# Patient Record
Sex: Female | Born: 1955 | Race: White | Hispanic: No | Marital: Married
Health system: Southern US, Community
[De-identification: ages and names within clinical notes are randomized; demographics above are authoritative.]

## PROBLEM LIST (undated history)

## (undated) DIAGNOSIS — C801 Malignant (primary) neoplasm, unspecified: Secondary | ICD-10-CM

## (undated) DIAGNOSIS — H409 Unspecified glaucoma: Secondary | ICD-10-CM

## (undated) HISTORY — DX: Unspecified glaucoma: H40.9

---

## 2007-05-12 ENCOUNTER — Ambulatory Visit: Payer: Self-pay | Admitting: Otolaryngology

## 2008-11-15 ENCOUNTER — Ambulatory Visit: Payer: Self-pay | Admitting: Otolaryngology

## 2008-12-16 ENCOUNTER — Ambulatory Visit: Payer: Self-pay | Admitting: Otolaryngology

## 2008-12-18 ENCOUNTER — Ambulatory Visit: Payer: Self-pay | Admitting: Otolaryngology

## 2010-08-10 ENCOUNTER — Ambulatory Visit: Payer: Self-pay

## 2011-02-23 HISTORY — PX: BREAST BIOPSY: SHX20

## 2011-06-24 ENCOUNTER — Ambulatory Visit: Payer: Self-pay | Admitting: Internal Medicine

## 2011-07-05 ENCOUNTER — Ambulatory Visit: Payer: Self-pay | Admitting: Internal Medicine

## 2011-08-02 ENCOUNTER — Ambulatory Visit: Payer: Self-pay | Admitting: Surgery

## 2011-08-03 LAB — PATHOLOGY REPORT

## 2012-01-06 ENCOUNTER — Ambulatory Visit: Payer: Self-pay | Admitting: Surgery

## 2012-06-26 ENCOUNTER — Ambulatory Visit: Payer: Self-pay | Admitting: Surgery

## 2013-12-31 ENCOUNTER — Ambulatory Visit: Payer: Self-pay | Admitting: Internal Medicine

## 2014-02-03 IMAGING — US ULTRASOUND LEFT BREAST
1 series · 16 of 25 positions shown · non-contrast
Comparison: none

REASON FOR EXAM: AV LT ASYMMETRY
COMMENTS:

PROCEDURE:     US  - US BREAST LEFT  - July 05, 2011  [DATE]
RESULT:
COMPARISON STUDY:   06/24/2011, [DATE], 05/29/07 and 05/27/2006.

[Series 1: ultrasound left breast · 42 acquisitions, 16 frames shown]
[im 1/42]
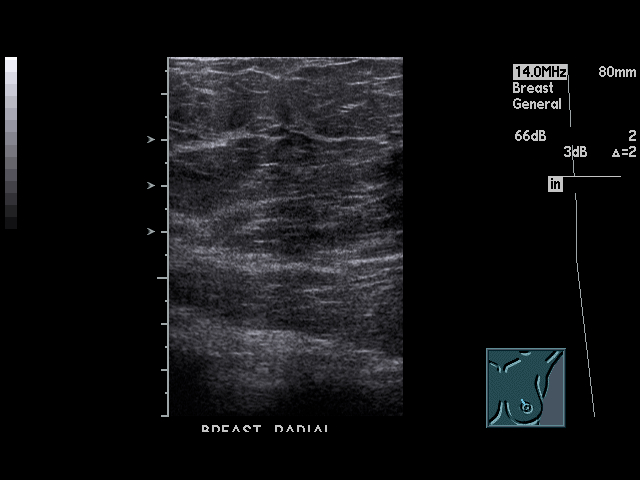
[im 4/42]
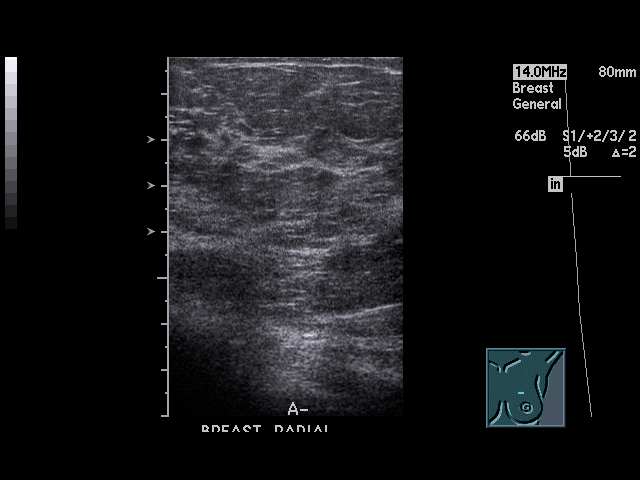
[im 6/42]
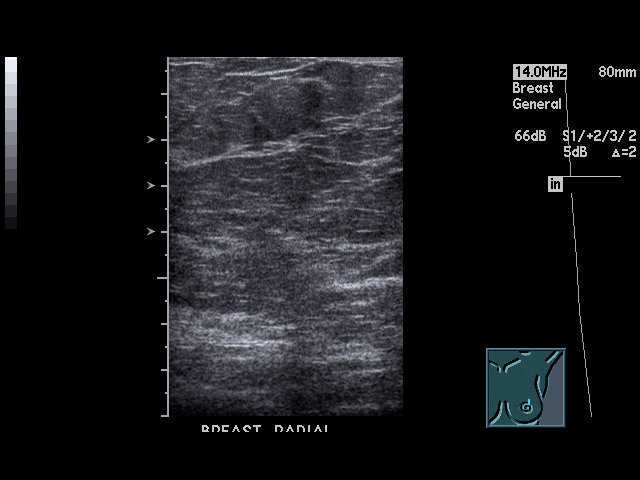
[im 9/42]
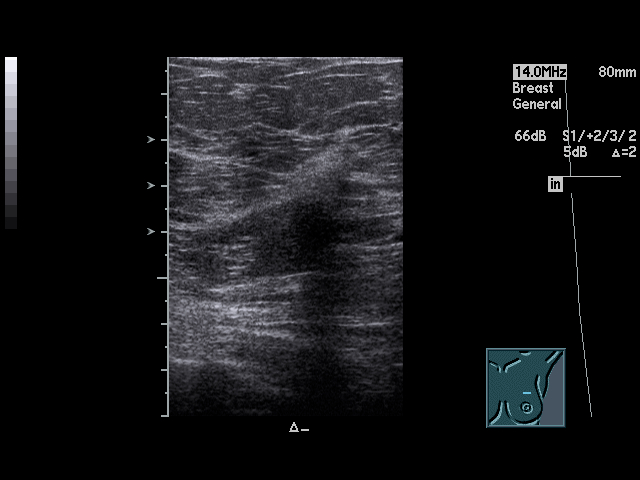
[im 12/42]
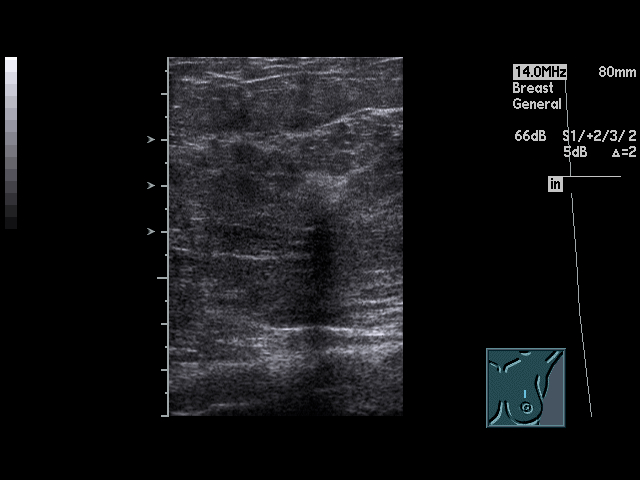
[im 14/42]
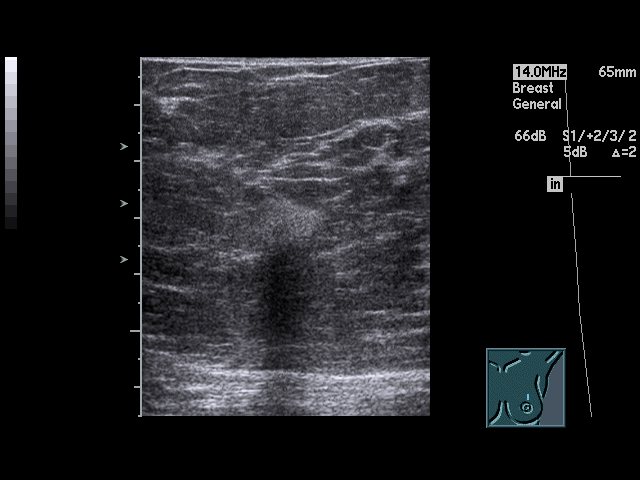
[im 18/42]
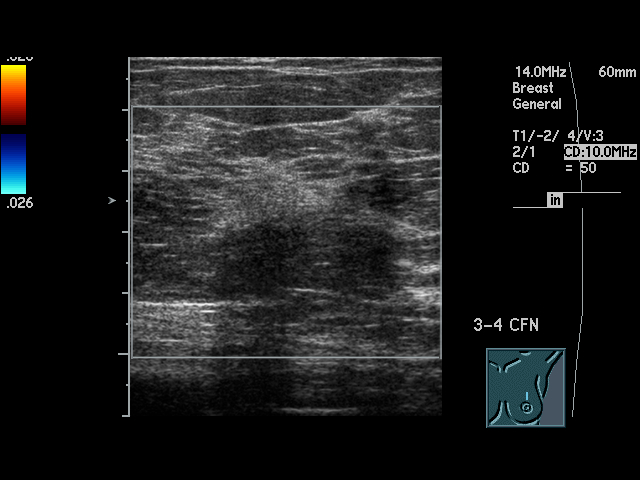
[im 19/42]
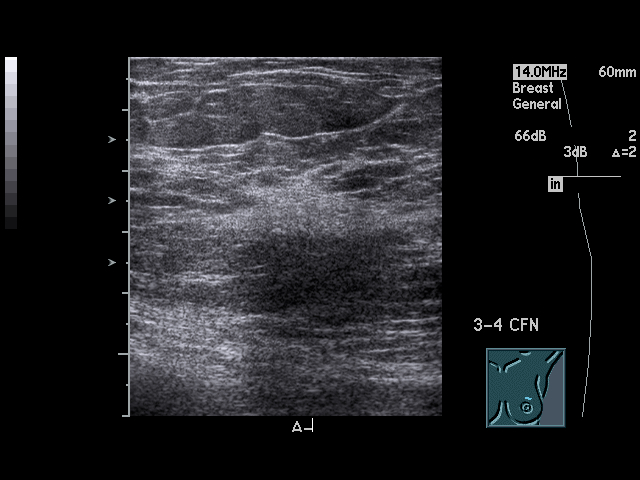
[im 23/42]
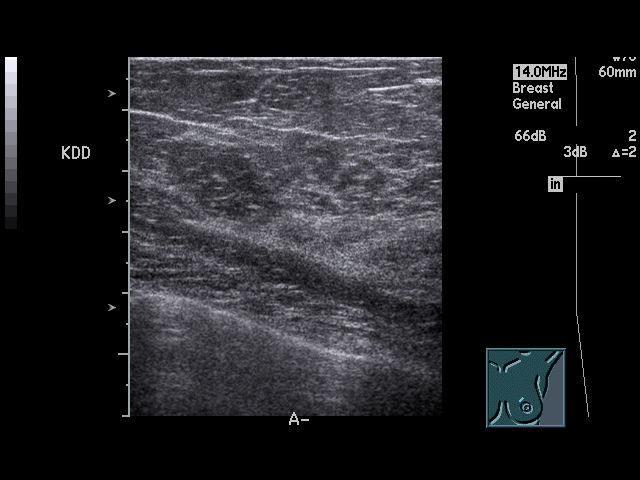
[im 24/42]
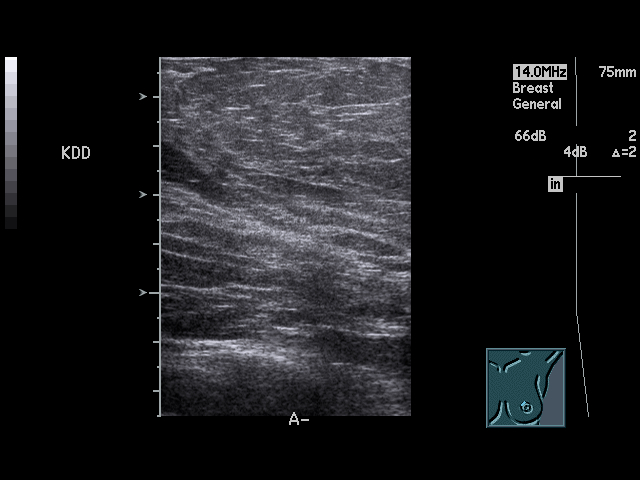
[im 28/42]
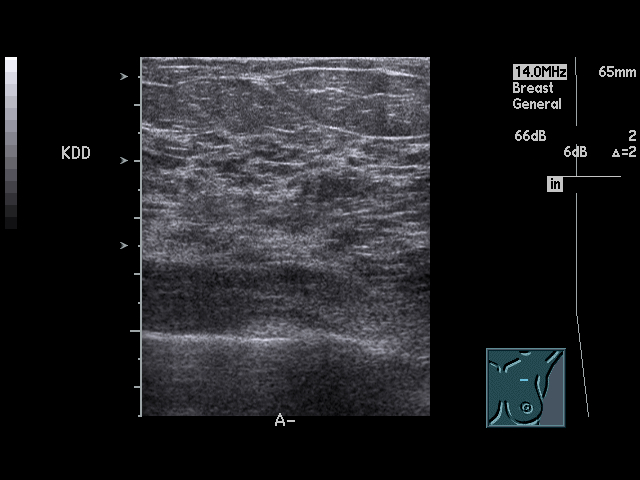
[im 30/42]
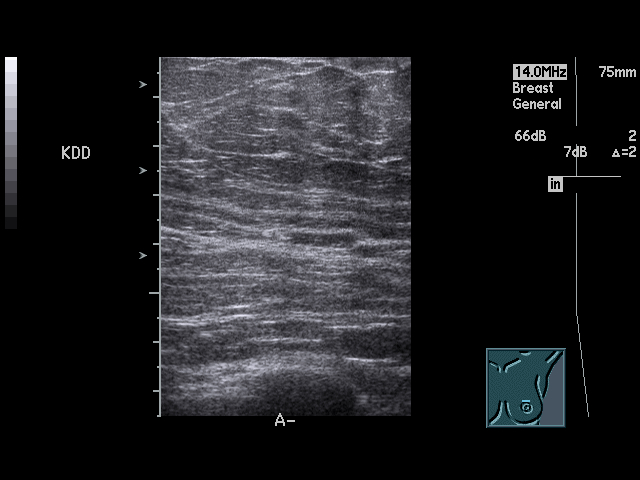
[im 33/42]
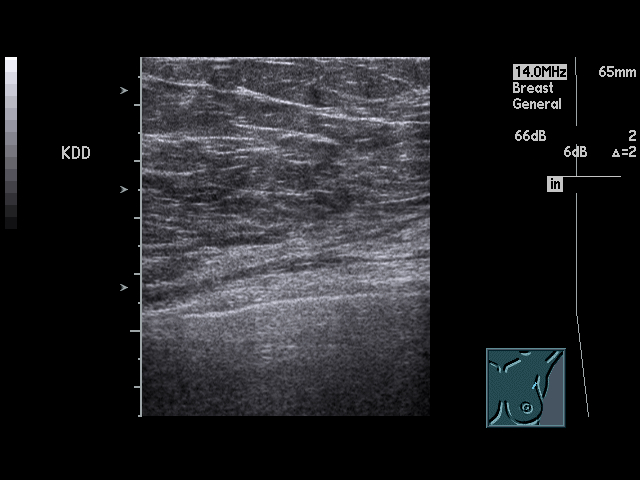
[im 36/42]
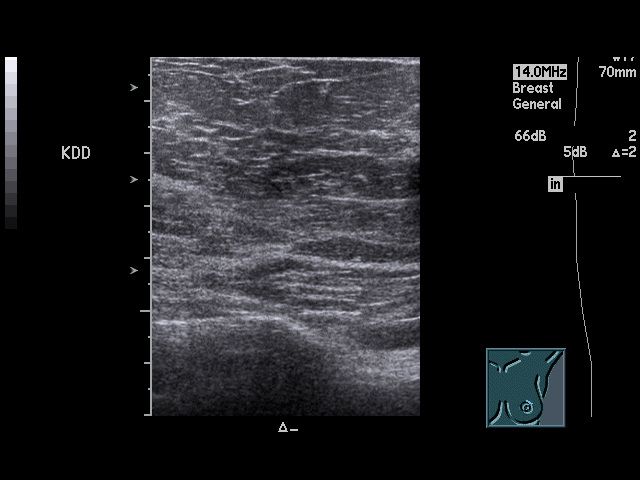
[im 38/42]
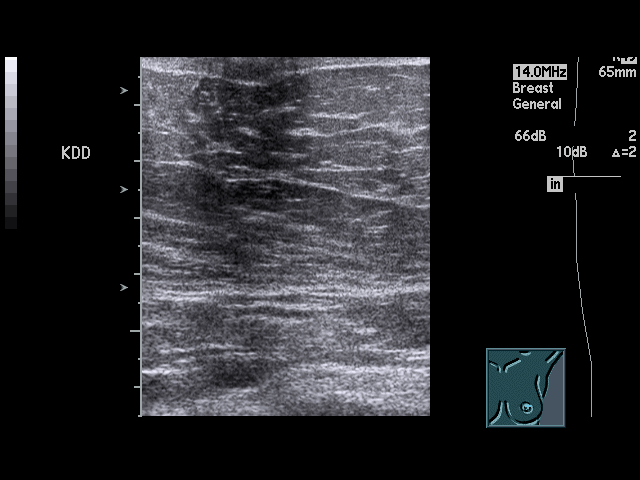
[im 42/42]
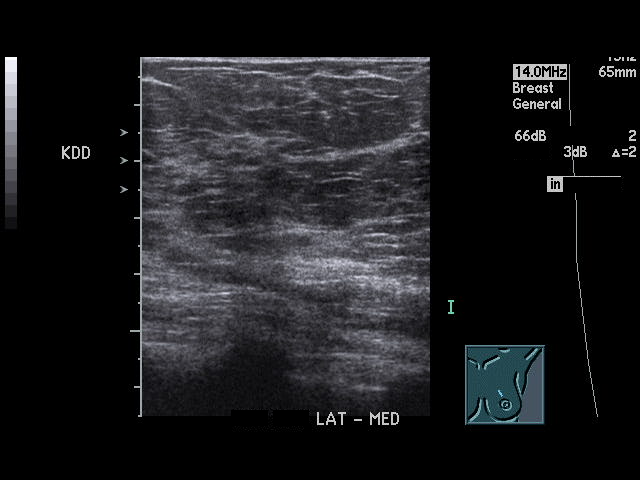

[16 of 25 positions shown; findings below may reference images not displayed]

FINDINGS: True lateral and spot compression magnification views were
performed to evaluate the findings in the left breast on the screening
mammograms. The focal asymmetry persisted on the spot compression
magnification views and appeared to take the form of a mass. Some of the
borders are well-circumscribed while other borders are indistinct. Some
margins are angular as seen on the CC compression view. This is located in
the superior aspect of the left breast at or just medial of midline, at
12-11 o'clock.  It measures approximately 1.1 x 0.8 cm.

On the spot compression MLO view, there is a small round asymmetry in the
posterior aspect of this image which effaced and assumed the appearance of
normal fibroglandular tissue with additional spot compression magnification
imaging. The focal asymmetry in the superior lateral aspect of the left
breast is unchanged to slightly decreased in conspicuity from multiple prior
studies including 05/27/2006.

Real-ultrasound was performed of the superior left breast from [DATE] through
[DATE]. No suspicious mass or shadowing was identified. On the images at the
end of the ultrasound examination, there is a subtle area of crescentic
hypodensity with central hyperechogenicity. With real-time imaging, this was
shown to blend within the normal parenchymal tissue and was not seen in the
orthogonal scan plane. There is some dense tissue in the superior left
breast which also effaced and blended within the normal parenchymal tissue
with real-time imaging. This may  correlat with the area of prominent
fibroglandular tissue in the superior left breast just lateral to midline,
that was similar to multiple prior studies.
IMPRESSION: 1.  BIRADS:   Category 4-Suspicious Abnormality
2.  Surgical consultation and tissue diagnosis is suggested. There is a mass
in the superior left breast, at or just medial of midline, which is new from
prior studies. As this was not seen by ultrasound, tissue diagnosis could be
attempted with Stereotactically-guided biopsy.

This was discussed with the patient immediately after the examination.

## 2014-02-03 IMAGING — MG MM ADDITIONAL VIEWS AT NO CHARGE
2 series · 4 of 4 positions shown · non-contrast
Comparison: none

REASON FOR EXAM: AV LT ASYMMETRY
COMMENTS:

PROCEDURE:     MAM - MAM DGTL ADD VW LT  SCR  - July 05, 2011  [DATE]
RESULT:
COMPARISON STUDY:   06/24/2011, [DATE], 05/29/07 and 05/27/2006.

[L ML · left · 3 of 3 slices shown]
[im 1/3]
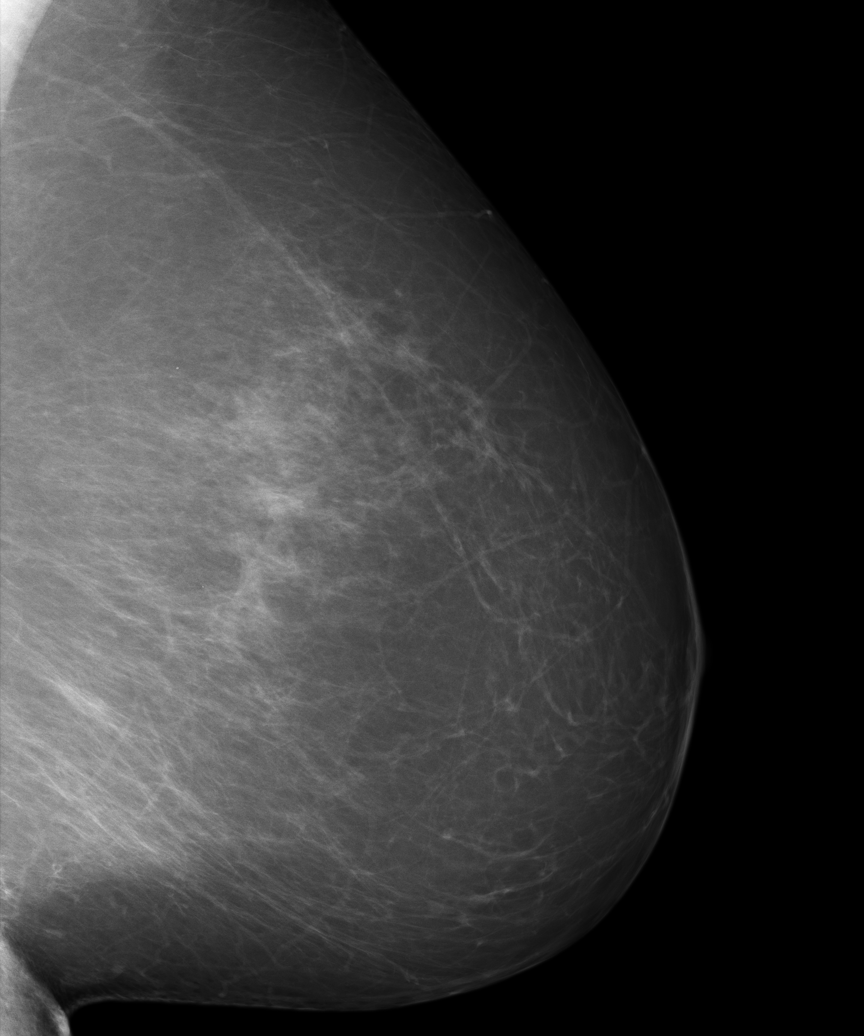
[im 2/3]
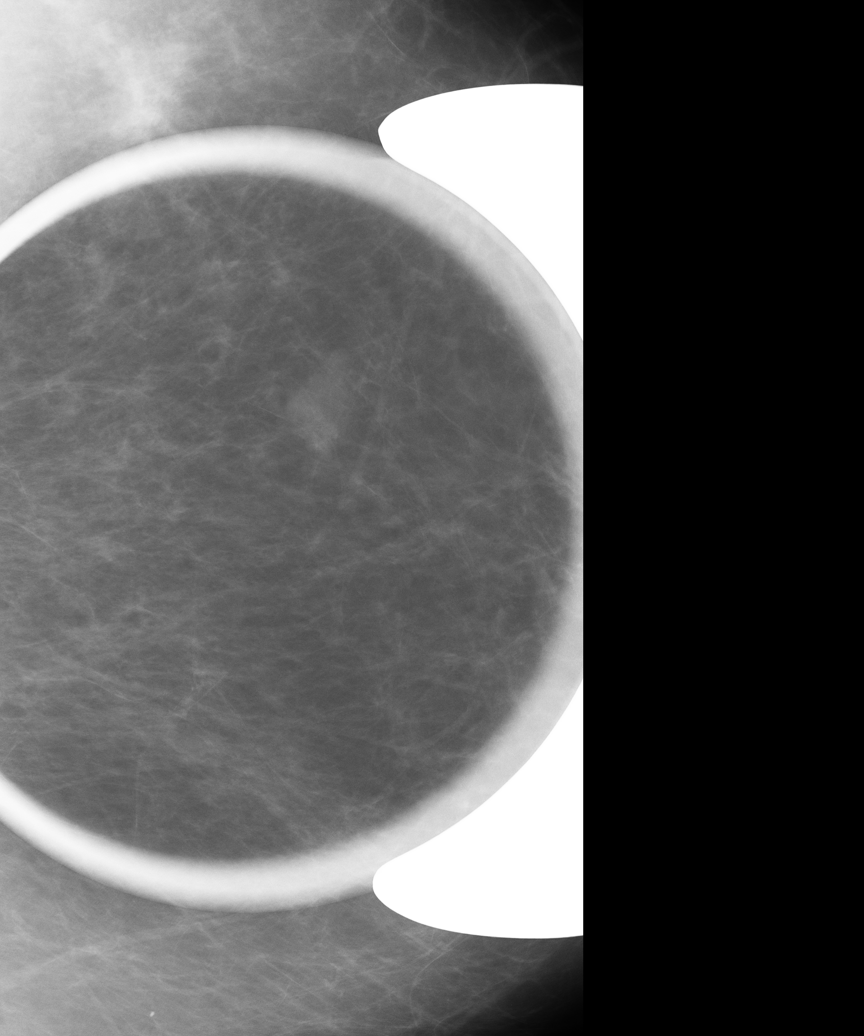
[im 3/3]
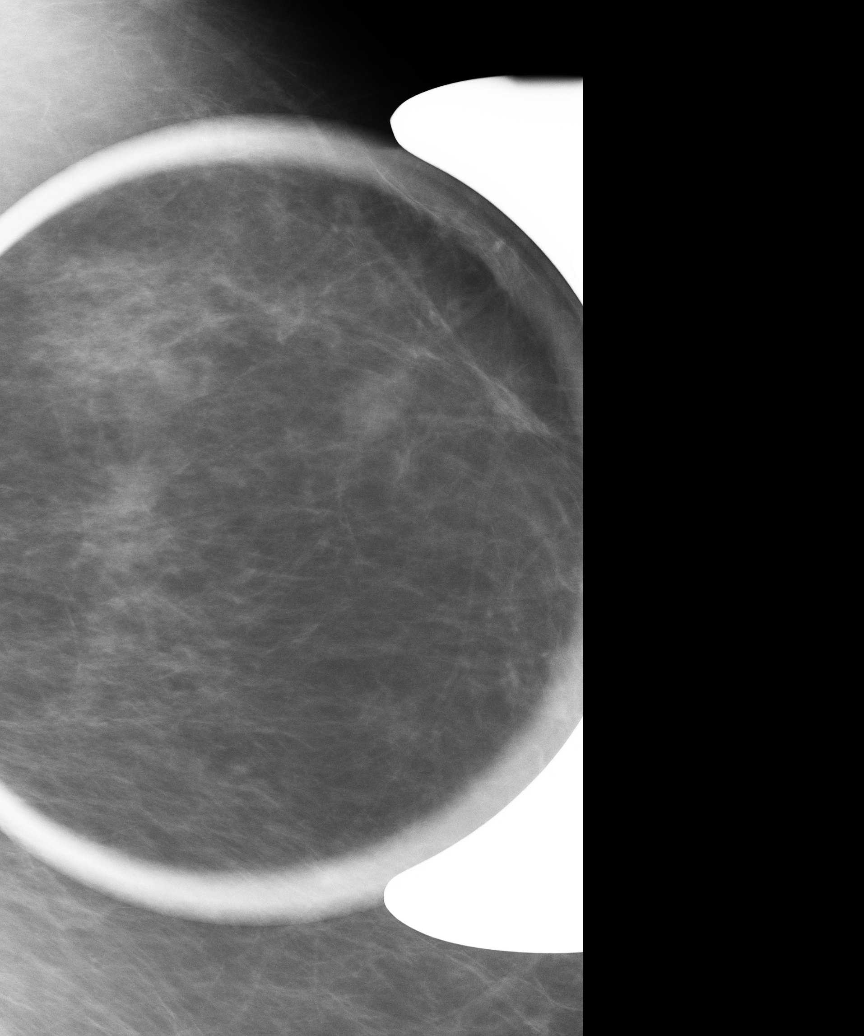

[L MLO]
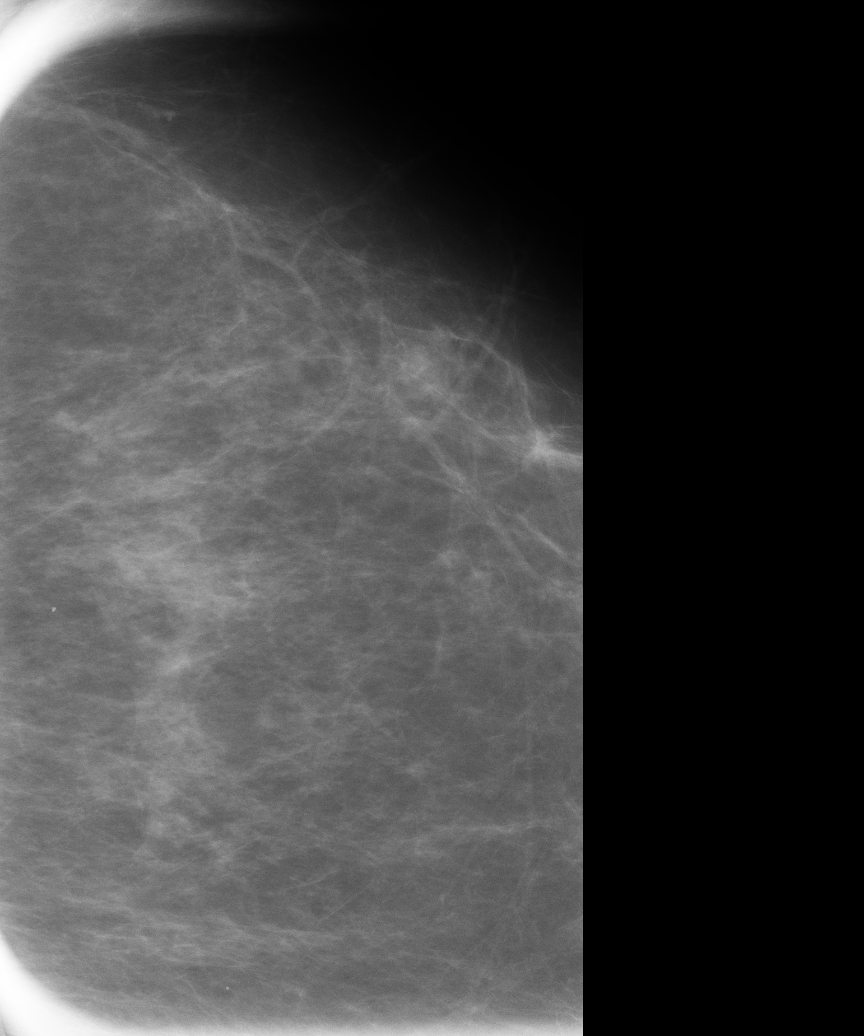

[4 of 4 positions shown; findings below may reference images not displayed]

FINDINGS: True lateral and spot compression magnification views were
performed to evaluate the findings in the left breast on the screening
mammograms. The focal asymmetry persisted on the spot compression
magnification views and appeared to take the form of a mass. Some of the
borders are well-circumscribed while other borders are indistinct. Some
margins are angular as seen on the CC compression view. This is located in
the superior aspect of the left breast at or just medial of midline, at
12-11 o'clock.  It measures approximately 1.1 x 0.8 cm.

On the spot compression MLO view, there is a small round asymmetry in the
posterior aspect of this image which effaced and assumed the appearance of
normal fibroglandular tissue with additional spot compression magnification
imaging. The focal asymmetry in the superior lateral aspect of the left
breast is unchanged to slightly decreased in conspicuity from multiple prior
studies including 05/27/2006.

Real-ultrasound was performed of the superior left breast from [DATE] through
[DATE]. No suspicious mass or shadowing was identified. On the images at the
end of the ultrasound examination, there is a subtle area of crescentic
hypodensity with central hyperechogenicity. With real-time imaging, this was
shown to blend within the normal parenchymal tissue and was not seen in the
orthogonal scan plane. There is some dense tissue in the superior left
breast which also effaced and blended within the normal parenchymal tissue
with real-time imaging. This may  correlat with the area of prominent
fibroglandular tissue in the superior left breast just lateral to midline,
that was similar to multiple prior studies.
IMPRESSION: 1.  BIRADS:   Category 4-Suspicious Abnormality
2.  Surgical consultation and tissue diagnosis is suggested. There is a mass
in the superior left breast, at or just medial of midline, which is new from
prior studies. As this was not seen by ultrasound, tissue diagnosis could be
attempted with Stereotactically-guided biopsy.

This was discussed with the patient immediately after the examination.

Thank you for the opportunity to contribute to the care of your patient.

A negative mammogram report does not preclude biopsy or other evaluation of
a clinically palpable or otherwise suspicious mass or lesion.  Breast cancer
may not be detected by mammography in up to 10% of cases.

## 2014-12-03 ENCOUNTER — Other Ambulatory Visit: Payer: Self-pay | Admitting: Internal Medicine

## 2014-12-03 DIAGNOSIS — Z1231 Encounter for screening mammogram for malignant neoplasm of breast: Secondary | ICD-10-CM

## 2015-01-02 ENCOUNTER — Ambulatory Visit
Admission: RE | Admit: 2015-01-02 | Discharge: 2015-01-02 | Disposition: A | Discharge status: Home / Self Care | Payer: Federal, State, Local not specified - PPO | Source: Ambulatory Visit | Attending: Internal Medicine | Admitting: Internal Medicine

## 2015-01-02 DIAGNOSIS — Z1231 Encounter for screening mammogram for malignant neoplasm of breast: Secondary | ICD-10-CM | POA: Insufficient documentation

## 2015-01-02 HISTORY — DX: Malignant (primary) neoplasm, unspecified: C80.1

## 2015-12-04 ENCOUNTER — Other Ambulatory Visit: Payer: Self-pay | Admitting: Internal Medicine

## 2015-12-04 DIAGNOSIS — Z1231 Encounter for screening mammogram for malignant neoplasm of breast: Secondary | ICD-10-CM

## 2016-01-08 ENCOUNTER — Ambulatory Visit
Admission: RE | Admit: 2016-01-08 | Discharge: 2016-01-08 | Disposition: A | Discharge status: Home / Self Care | Payer: Federal, State, Local not specified - PPO | Source: Ambulatory Visit | Attending: Internal Medicine | Admitting: Internal Medicine

## 2016-01-08 DIAGNOSIS — Z1231 Encounter for screening mammogram for malignant neoplasm of breast: Secondary | ICD-10-CM | POA: Diagnosis not present

## 2016-11-29 ENCOUNTER — Other Ambulatory Visit: Payer: Self-pay | Admitting: Internal Medicine

## 2016-11-29 DIAGNOSIS — Z1231 Encounter for screening mammogram for malignant neoplasm of breast: Secondary | ICD-10-CM

## 2017-01-10 ENCOUNTER — Ambulatory Visit
Admission: RE | Admit: 2017-01-10 | Discharge: 2017-01-10 | Disposition: A | Discharge status: Home / Self Care | Payer: Federal, State, Local not specified - PPO | Source: Ambulatory Visit | Attending: Internal Medicine | Admitting: Internal Medicine

## 2017-01-10 DIAGNOSIS — Z1231 Encounter for screening mammogram for malignant neoplasm of breast: Secondary | ICD-10-CM | POA: Insufficient documentation

## 2017-12-06 ENCOUNTER — Other Ambulatory Visit: Payer: Self-pay | Admitting: Internal Medicine

## 2017-12-06 DIAGNOSIS — Z1231 Encounter for screening mammogram for malignant neoplasm of breast: Secondary | ICD-10-CM

## 2018-01-13 ENCOUNTER — Ambulatory Visit
Admission: RE | Admit: 2018-01-13 | Discharge: 2018-01-13 | Disposition: A | Discharge status: Home / Self Care | Payer: Federal, State, Local not specified - PPO | Source: Ambulatory Visit | Attending: Internal Medicine | Admitting: Internal Medicine

## 2018-01-13 DIAGNOSIS — Z1231 Encounter for screening mammogram for malignant neoplasm of breast: Secondary | ICD-10-CM

## 2018-12-07 ENCOUNTER — Other Ambulatory Visit: Payer: Self-pay | Admitting: Internal Medicine

## 2018-12-07 DIAGNOSIS — Z1231 Encounter for screening mammogram for malignant neoplasm of breast: Secondary | ICD-10-CM

## 2019-01-25 ENCOUNTER — Ambulatory Visit
Admission: RE | Admit: 2019-01-25 | Discharge: 2019-01-25 | Disposition: A | Discharge status: Home / Self Care | Payer: Federal, State, Local not specified - PPO | Source: Ambulatory Visit | Attending: Internal Medicine | Admitting: Internal Medicine

## 2019-01-25 DIAGNOSIS — Z1231 Encounter for screening mammogram for malignant neoplasm of breast: Secondary | ICD-10-CM

## 2019-12-17 ENCOUNTER — Other Ambulatory Visit: Payer: Self-pay | Admitting: Internal Medicine

## 2019-12-17 DIAGNOSIS — Z1231 Encounter for screening mammogram for malignant neoplasm of breast: Secondary | ICD-10-CM

## 2020-01-30 ENCOUNTER — Ambulatory Visit
Admission: RE | Admit: 2020-01-30 | Discharge: 2020-01-30 | Disposition: A | Discharge status: Home / Self Care | Payer: Federal, State, Local not specified - PPO | Source: Ambulatory Visit | Attending: Internal Medicine | Admitting: Internal Medicine

## 2020-01-30 ENCOUNTER — Other Ambulatory Visit: Payer: Self-pay

## 2020-01-30 DIAGNOSIS — Z1231 Encounter for screening mammogram for malignant neoplasm of breast: Secondary | ICD-10-CM | POA: Insufficient documentation

## 2020-02-18 ENCOUNTER — Other Ambulatory Visit: Payer: Self-pay | Admitting: Physician Assistant

## 2020-02-23 ENCOUNTER — Other Ambulatory Visit: Payer: Self-pay | Admitting: Physician Assistant

## 2020-02-26 NOTE — Telephone Encounter (Signed)
Patient left message on office voice mail saying that she had called the pharmacy to get a refill on the medicated shampoo and the pharmacy told her that they had not heard back from our office.

## 2020-12-16 ENCOUNTER — Other Ambulatory Visit: Payer: Self-pay | Admitting: Cardiology

## 2020-12-16 DIAGNOSIS — I10 Essential (primary) hypertension: Secondary | ICD-10-CM

## 2020-12-16 DIAGNOSIS — Z136 Encounter for screening for cardiovascular disorders: Secondary | ICD-10-CM

## 2020-12-24 ENCOUNTER — Inpatient Hospital Stay: Admission: RE | Admit: 2020-12-24 | Payer: Federal, State, Local not specified - PPO | Source: Ambulatory Visit

## 2020-12-26 ENCOUNTER — Other Ambulatory Visit: Payer: Self-pay | Admitting: Internal Medicine

## 2020-12-26 DIAGNOSIS — Z1231 Encounter for screening mammogram for malignant neoplasm of breast: Secondary | ICD-10-CM

## 2021-01-06 ENCOUNTER — Ambulatory Visit
Admission: RE | Admit: 2021-01-06 | Discharge: 2021-01-06 | Disposition: A | Discharge status: Home / Self Care | Payer: Federal, State, Local not specified - PPO | Source: Ambulatory Visit | Attending: Cardiology | Admitting: Cardiology

## 2021-01-06 ENCOUNTER — Other Ambulatory Visit: Payer: Self-pay

## 2021-01-06 DIAGNOSIS — I10 Essential (primary) hypertension: Secondary | ICD-10-CM | POA: Insufficient documentation

## 2021-01-06 DIAGNOSIS — Z136 Encounter for screening for cardiovascular disorders: Secondary | ICD-10-CM | POA: Insufficient documentation

## 2021-01-30 ENCOUNTER — Ambulatory Visit
Admission: RE | Admit: 2021-01-30 | Discharge: 2021-01-30 | Disposition: A | Discharge status: Home / Self Care | Payer: Federal, State, Local not specified - PPO | Source: Ambulatory Visit | Attending: Internal Medicine | Admitting: Internal Medicine

## 2021-01-30 ENCOUNTER — Other Ambulatory Visit: Payer: Self-pay

## 2021-01-30 DIAGNOSIS — Z1231 Encounter for screening mammogram for malignant neoplasm of breast: Secondary | ICD-10-CM | POA: Diagnosis present

## 2021-05-21 ENCOUNTER — Ambulatory Visit: Payer: Federal, State, Local not specified - PPO | Admitting: Physician Assistant

## 2021-06-11 ENCOUNTER — Encounter: Payer: Self-pay | Admitting: Physician Assistant

## 2021-06-11 ENCOUNTER — Ambulatory Visit (INDEPENDENT_AMBULATORY_CARE_PROVIDER_SITE_OTHER): Payer: Medicare Other | Admitting: Physician Assistant

## 2021-06-11 DIAGNOSIS — L821 Other seborrheic keratosis: Secondary | ICD-10-CM

## 2021-06-11 DIAGNOSIS — Z1283 Encounter for screening for malignant neoplasm of skin: Secondary | ICD-10-CM | POA: Diagnosis not present

## 2021-06-11 DIAGNOSIS — Z86018 Personal history of other benign neoplasm: Secondary | ICD-10-CM

## 2021-06-11 DIAGNOSIS — Z808 Family history of malignant neoplasm of other organs or systems: Secondary | ICD-10-CM

## 2021-06-15 ENCOUNTER — Encounter: Payer: Self-pay | Admitting: Physician Assistant

## 2021-06-15 NOTE — Progress Notes (Signed)
? ?  Follow-Up Visit ?  ?Subjective  ?Samantha Johns is a 66 y.o. female who presents for the following: Annual Exam (No new concerns- Personal history of atypical nevi, but no melanoma or non mole skin cancer. Family h/o melanoma but no non mole skin cancers. ). ? ? ?The following portions of the chart were reviewed this encounter and updated as appropriate:  Tobacco  Allergies  Meds  Problems  Med Hx  Surg Hx  Fam Hx   ?  ? ?Objective  ?Well appearing patient in no apparent distress; mood and affect are within normal limits. ? ?A full examination was performed including scalp, head, eyes, ears, nose, lips, neck, chest, axillae, abdomen, back, buttocks, bilateral upper extremities, bilateral lower extremities, hands, feet, fingers, toes, fingernails, and toenails. All findings within normal limits unless otherwise noted below. ? ? No atypical nevi or signs of NMSC noted at the time of the visit.  ? ?Stuck-on, crusted tan-brown papules and plaques. --Discussed benign etiology and prognosis.  ? ? ?Assessment & Plan  ?Encounter for screening for malignant neoplasm of skin ? ?Yearly skin examination  ? ?Seborrheic keratosis ? ?Okay to leave if stable ? ?No atypical nevi noted at the time of the visit. ? ?I, Danyal Adorno, PA-C, have reviewed all documentation's for this visit.  The documentation on 06/15/21 for the exam, diagnosis, procedures and orders are all accurate and complete. ?

## 2021-12-24 ENCOUNTER — Other Ambulatory Visit: Payer: Self-pay | Admitting: Internal Medicine

## 2021-12-24 DIAGNOSIS — Z1231 Encounter for screening mammogram for malignant neoplasm of breast: Secondary | ICD-10-CM

## 2022-02-02 ENCOUNTER — Ambulatory Visit
Admission: RE | Admit: 2022-02-02 | Discharge: 2022-02-02 | Disposition: A | Discharge status: Home / Self Care | Payer: Medicare Other | Source: Ambulatory Visit | Attending: Internal Medicine | Admitting: Internal Medicine

## 2022-02-02 DIAGNOSIS — Z1231 Encounter for screening mammogram for malignant neoplasm of breast: Secondary | ICD-10-CM | POA: Diagnosis not present

## 2022-11-20 ENCOUNTER — Other Ambulatory Visit
Admission: RE | Admit: 2022-11-20 | Discharge: 2022-11-20 | Disposition: A | Discharge status: Home / Self Care | Payer: Medicare Other | Attending: Family Medicine | Admitting: Family Medicine

## 2022-11-20 DIAGNOSIS — M79674 Pain in right toe(s): Secondary | ICD-10-CM | POA: Diagnosis present

## 2022-11-20 DIAGNOSIS — M109 Gout, unspecified: Secondary | ICD-10-CM | POA: Diagnosis present

## 2022-11-20 LAB — BASIC METABOLIC PANEL
Anion gap: 9 (ref 5–15)
BUN: 21 mg/dL (ref 8–23)
CO2: 27 mmol/L (ref 22–32)
Calcium: 9.4 mg/dL (ref 8.9–10.3)
Chloride: 100 mmol/L (ref 98–111)
Creatinine, Ser: 0.82 mg/dL (ref 0.44–1.00)
GFR, Estimated: >60 mL/min (ref >60)
Glucose, Bld: 98 mg/dL (ref 70–99)
Potassium: 4.1 mmol/L (ref 3.5–5.1)
Sodium: 136 mmol/L (ref 135–145)

## 2022-11-20 LAB — CBC
HCT: 45.2 % (ref 36.0–46.0)
Hemoglobin: 14.8 g/dL (ref 12.0–15.0)
MCH: 27.9 pg (ref 26.0–34.0)
MCHC: 32.7 g/dL (ref 30.0–36.0)
MCV: 85.1 fL (ref 80.0–100.0)
Platelets: 209 10*3/uL (ref 150–400)
RBC: 5.31 MIL/uL — ABNORMAL HIGH (ref 3.87–5.11)
RDW: 14.5 % (ref 11.5–15.5)
WBC: 8.5 10*3/uL (ref 4.0–10.5)
nRBC: 0 % (ref 0.0–0.2)

## 2023-01-04 ENCOUNTER — Other Ambulatory Visit: Payer: Self-pay | Admitting: Internal Medicine

## 2023-01-04 DIAGNOSIS — Z1231 Encounter for screening mammogram for malignant neoplasm of breast: Secondary | ICD-10-CM

## 2023-02-08 ENCOUNTER — Ambulatory Visit
Admission: RE | Admit: 2023-02-08 | Discharge: 2023-02-08 | Disposition: A | Discharge status: Home / Self Care | Payer: Medicare Other | Source: Ambulatory Visit | Attending: Internal Medicine | Admitting: Internal Medicine

## 2023-02-08 DIAGNOSIS — Z1231 Encounter for screening mammogram for malignant neoplasm of breast: Secondary | ICD-10-CM | POA: Insufficient documentation
